# Patient Record
Sex: Male | Born: 1985 | Race: White | Hispanic: No | Marital: Single | Smoking: Former smoker
Health system: Southern US, Community
[De-identification: ages and names within clinical notes are randomized; demographics above are authoritative.]

## PROBLEM LIST (undated history)

## (undated) HISTORY — PX: HIP SURGERY: SHX245

---

## 2014-06-03 ENCOUNTER — Emergency Department: Payer: Self-pay | Admitting: Emergency Medicine

## 2019-12-09 ENCOUNTER — Encounter
Admission: EM | Disposition: A | Discharge status: Home Health | Payer: Self-pay | Source: Home / Self Care | Attending: Internal Medicine

## 2019-12-09 ENCOUNTER — Inpatient Hospital Stay: Payer: Self-pay | Admitting: Certified Registered Nurse Anesthetist

## 2019-12-09 ENCOUNTER — Emergency Department: Payer: Self-pay

## 2019-12-09 ENCOUNTER — Inpatient Hospital Stay
Admission: EM | Admit: 2019-12-09 | Discharge: 2019-12-11 | DRG: 482 | Disposition: A | Discharge status: Home Health | Payer: Self-pay | Attending: Internal Medicine | Admitting: Internal Medicine

## 2019-12-09 ENCOUNTER — Inpatient Hospital Stay: Payer: Self-pay

## 2019-12-09 ENCOUNTER — Other Ambulatory Visit: Payer: Self-pay

## 2019-12-09 ENCOUNTER — Encounter: Payer: Self-pay | Admitting: Emergency Medicine

## 2019-12-09 DIAGNOSIS — F17290 Nicotine dependence, other tobacco product, uncomplicated: Secondary | ICD-10-CM | POA: Diagnosis present

## 2019-12-09 DIAGNOSIS — S72001A Fracture of unspecified part of neck of right femur, initial encounter for closed fracture: Principal | ICD-10-CM

## 2019-12-09 DIAGNOSIS — K219 Gastro-esophageal reflux disease without esophagitis: Secondary | ICD-10-CM | POA: Diagnosis present

## 2019-12-09 DIAGNOSIS — R52 Pain, unspecified: Secondary | ICD-10-CM

## 2019-12-09 DIAGNOSIS — D72829 Elevated white blood cell count, unspecified: Secondary | ICD-10-CM

## 2019-12-09 DIAGNOSIS — Z20828 Contact with and (suspected) exposure to other viral communicable diseases: Secondary | ICD-10-CM | POA: Diagnosis present

## 2019-12-09 DIAGNOSIS — S7291XA Unspecified fracture of right femur, initial encounter for closed fracture: Secondary | ICD-10-CM | POA: Diagnosis present

## 2019-12-09 DIAGNOSIS — R739 Hyperglycemia, unspecified: Secondary | ICD-10-CM

## 2019-12-09 DIAGNOSIS — Y92008 Other place in unspecified non-institutional (private) residence as the place of occurrence of the external cause: Secondary | ICD-10-CM

## 2019-12-09 DIAGNOSIS — S0081XA Abrasion of other part of head, initial encounter: Secondary | ICD-10-CM

## 2019-12-09 HISTORY — PX: PERCUTANEOUS PINNING: SHX2209

## 2019-12-09 LAB — BASIC METABOLIC PANEL
Anion gap: 13 (ref 5–15)
BUN: 13 mg/dL (ref 6–20)
CO2: 25 mmol/L (ref 22–32)
Calcium: 9.4 mg/dL (ref 8.9–10.3)
Chloride: 98 mmol/L (ref 98–111)
Creatinine, Ser: 0.81 mg/dL (ref 0.61–1.24)
GFR calc Af Amer: >60 mL/min (ref >60)
GFR calc non Af Amer: >60 mL/min (ref >60)
Glucose, Bld: 114 mg/dL — ABNORMAL HIGH (ref 70–99)
Potassium: 3.8 mmol/L (ref 3.5–5.1)
Sodium: 136 mmol/L (ref 135–145)

## 2019-12-09 LAB — CBC
HCT: 46.6 % (ref 39.0–52.0)
Hemoglobin: 15.8 g/dL (ref 13.0–17.0)
MCH: 29.8 pg (ref 26.0–34.0)
MCHC: 33.9 g/dL (ref 30.0–36.0)
MCV: 87.9 fL (ref 80.0–100.0)
Platelets: 238 10*3/uL (ref 150–400)
RBC: 5.3 MIL/uL (ref 4.22–5.81)
RDW: 12.6 % (ref 11.5–15.5)
WBC: 17.3 10*3/uL — ABNORMAL HIGH (ref 4.0–10.5)
nRBC: 0 % (ref 0.0–0.2)

## 2019-12-09 LAB — URINE DRUG SCREEN, QUALITATIVE (ARMC ONLY)
Amphetamines, Ur Screen: NOT DETECTED
Barbiturates, Ur Screen: NOT DETECTED
Benzodiazepine, Ur Scrn: NOT DETECTED
Cannabinoid 50 Ng, Ur ~~LOC~~: POSITIVE — AB
Cocaine Metabolite,Ur ~~LOC~~: NOT DETECTED
MDMA (Ecstasy)Ur Screen: NOT DETECTED
Methadone Scn, Ur: NOT DETECTED
Opiate, Ur Screen: POSITIVE — AB
Phencyclidine (PCP) Ur S: NOT DETECTED
Tricyclic, Ur Screen: NOT DETECTED

## 2019-12-09 LAB — RESPIRATORY PANEL BY RT PCR (FLU A&B, COVID)
Influenza A by PCR: NEGATIVE
Influenza B by PCR: NEGATIVE
SARS Coronavirus 2 by RT PCR: NEGATIVE

## 2019-12-09 LAB — TYPE AND SCREEN
ABO/RH(D): A POS
Antibody Screen: NEGATIVE

## 2019-12-09 LAB — PROTIME-INR
INR: 1 (ref 0.8–1.2)
Prothrombin Time: 13.4 seconds (ref 11.4–15.2)

## 2019-12-09 LAB — ETHANOL: Alcohol, Ethyl (B): <10 mg/dL (ref <10)

## 2019-12-09 SURGERY — PINNING, EXTREMITY, PERCUTANEOUS
Anesthesia: General | Laterality: Right

## 2019-12-09 MED ORDER — FENTANYL CITRATE (PF) 100 MCG/2ML IJ SOLN
INTRAMUSCULAR | Status: DC | PRN
  Administered 2019-12-09 (×2): 50 ug via INTRAVENOUS

## 2019-12-09 MED ORDER — OXYCODONE HCL 5 MG/5ML PO SOLN: 5.0000 mg | Freq: Once | ORAL | Status: DC | PRN

## 2019-12-09 MED ORDER — MAGNESIUM HYDROXIDE 400 MG/5ML PO SUSP: 30.0000 mL | Freq: Every day | ORAL | Status: DC | PRN

## 2019-12-09 MED ORDER — CEFAZOLIN SODIUM 1 G IJ SOLR
INTRAMUSCULAR | Status: AC
  Filled 2019-12-09: qty 20

## 2019-12-09 MED ORDER — OXYCODONE HCL 5 MG PO TABS
10.0000 mg | ORAL_TABLET | ORAL | Status: DC | PRN
  Administered 2019-12-10: 10 mg via ORAL

## 2019-12-09 MED ORDER — BISACODYL 10 MG RE SUPP: 10.0000 mg | Freq: Every day | RECTAL | Status: DC | PRN

## 2019-12-09 MED ORDER — ONDANSETRON HCL 4 MG PO TABS: 4.0000 mg | ORAL_TABLET | Freq: Four times a day (QID) | ORAL | Status: DC | PRN

## 2019-12-09 MED ORDER — GABAPENTIN 300 MG PO CAPS
300.0000 mg | ORAL_CAPSULE | Freq: Every day | ORAL | Status: DC
  Administered 2019-12-10: 300 mg via ORAL
  Filled 2019-12-09 (×2): qty 1

## 2019-12-09 MED ORDER — DEXAMETHASONE SODIUM PHOSPHATE 10 MG/ML IJ SOLN
INTRAMUSCULAR | Status: AC
  Filled 2019-12-09: qty 1

## 2019-12-09 MED ORDER — ACETAMINOPHEN 650 MG RE SUPP: 650.0000 mg | Freq: Four times a day (QID) | RECTAL | Status: DC | PRN

## 2019-12-09 MED ORDER — ONDANSETRON HCL 4 MG/2ML IJ SOLN: 4.0000 mg | Freq: Four times a day (QID) | INTRAMUSCULAR | Status: DC | PRN

## 2019-12-09 MED ORDER — PROPOFOL 10 MG/ML IV BOLUS
INTRAVENOUS | Status: AC
  Filled 2019-12-09: qty 20

## 2019-12-09 MED ORDER — CEFAZOLIN SODIUM-DEXTROSE 2-4 GM/100ML-% IV SOLN
2.0000 g | Freq: Four times a day (QID) | INTRAVENOUS | Status: AC
  Administered 2019-12-09 – 2019-12-10 (×4): 2 g via INTRAVENOUS
  Filled 2019-12-09 (×5): qty 100

## 2019-12-09 MED ORDER — FENTANYL CITRATE (PF) 100 MCG/2ML IJ SOLN
INTRAMUSCULAR | Status: AC
  Filled 2019-12-09: qty 2

## 2019-12-09 MED ORDER — SODIUM CHLORIDE 0.9 % IV SOLN: INTRAVENOUS | Status: DC

## 2019-12-09 MED ORDER — ONDANSETRON HCL 4 MG/2ML IJ SOLN
4.0000 mg | Freq: Once | INTRAMUSCULAR | Status: AC
  Administered 2019-12-09: 4 mg via INTRAVENOUS
  Filled 2019-12-09: qty 2

## 2019-12-09 MED ORDER — SUCCINYLCHOLINE CHLORIDE 20 MG/ML IJ SOLN
INTRAMUSCULAR | Status: AC
  Filled 2019-12-09: qty 1

## 2019-12-09 MED ORDER — KETOROLAC TROMETHAMINE 30 MG/ML IJ SOLN
30.0000 mg | Freq: Four times a day (QID) | INTRAMUSCULAR | Status: DC | PRN
  Administered 2019-12-09 – 2019-12-10 (×2): 30 mg via INTRAVENOUS
  Filled 2019-12-09 (×2): qty 1

## 2019-12-09 MED ORDER — OXYCODONE HCL 5 MG PO TABS
5.0000 mg | ORAL_TABLET | ORAL | Status: DC | PRN
  Administered 2019-12-09 – 2019-12-10 (×2): 5 mg via ORAL
  Filled 2019-12-09 (×3): qty 1

## 2019-12-09 MED ORDER — DEXMEDETOMIDINE HCL 200 MCG/2ML IV SOLN
INTRAVENOUS | Status: DC | PRN
  Administered 2019-12-09: 8 ug via INTRAVENOUS

## 2019-12-09 MED ORDER — ACETAMINOPHEN 325 MG PO TABS: 650.0000 mg | ORAL_TABLET | Freq: Four times a day (QID) | ORAL | Status: DC | PRN

## 2019-12-09 MED ORDER — ONDANSETRON HCL 4 MG/2ML IJ SOLN
INTRAMUSCULAR | Status: DC | PRN
  Administered 2019-12-09: 4 mg via INTRAVENOUS

## 2019-12-09 MED ORDER — HYDROMORPHONE HCL 1 MG/ML IJ SOLN: 1.0000 mg | INTRAMUSCULAR | Status: DC | PRN

## 2019-12-09 MED ORDER — LIDOCAINE HCL (CARDIAC) PF 100 MG/5ML IV SOSY
PREFILLED_SYRINGE | INTRAVENOUS | Status: DC | PRN
  Administered 2019-12-09: 80 mg via INTRAVENOUS

## 2019-12-09 MED ORDER — PHENOL 1.4 % MT LIQD
1.0000 | OROMUCOSAL | Status: DC | PRN
  Filled 2019-12-09: qty 177

## 2019-12-09 MED ORDER — ASPIRIN EC 325 MG PO TBEC
325.0000 mg | DELAYED_RELEASE_TABLET | Freq: Every day | ORAL | Status: DC
  Administered 2019-12-10 – 2019-12-11 (×2): 325 mg via ORAL
  Filled 2019-12-09 (×3): qty 1

## 2019-12-09 MED ORDER — MORPHINE SULFATE (PF) 4 MG/ML IV SOLN
4.0000 mg | Freq: Once | INTRAVENOUS | Status: AC
  Administered 2019-12-09: 4 mg via INTRAVENOUS
  Filled 2019-12-09: qty 1

## 2019-12-09 MED ORDER — PROPOFOL 10 MG/ML IV BOLUS
INTRAVENOUS | Status: DC | PRN
  Administered 2019-12-09: 120 mg via INTRAVENOUS

## 2019-12-09 MED ORDER — METOCLOPRAMIDE HCL 10 MG PO TABS: 5.0000 mg | ORAL_TABLET | Freq: Three times a day (TID) | ORAL | Status: DC | PRN

## 2019-12-09 MED ORDER — TETANUS-DIPHTH-ACELL PERTUSSIS 5-2.5-18.5 LF-MCG/0.5 IM SUSP
0.5000 mL | Freq: Once | INTRAMUSCULAR | Status: AC
  Administered 2019-12-09: 07:00:00 0.5 mL via INTRAMUSCULAR
  Filled 2019-12-09: qty 0.5

## 2019-12-09 MED ORDER — ONDANSETRON HCL 4 MG/2ML IJ SOLN
INTRAMUSCULAR | Status: AC
  Filled 2019-12-09: qty 2

## 2019-12-09 MED ORDER — FENTANYL CITRATE (PF) 100 MCG/2ML IJ SOLN
25.0000 ug | INTRAMUSCULAR | Status: DC | PRN
  Administered 2019-12-09 (×4): 50 ug via INTRAVENOUS

## 2019-12-09 MED ORDER — SUCCINYLCHOLINE CHLORIDE 20 MG/ML IJ SOLN
INTRAMUSCULAR | Status: DC | PRN
  Administered 2019-12-09: 100 mg via INTRAVENOUS

## 2019-12-09 MED ORDER — POLYETHYLENE GLYCOL 3350 17 G PO PACK: 17.0000 g | PACK | Freq: Every day | ORAL | Status: DC | PRN

## 2019-12-09 MED ORDER — MIDAZOLAM HCL 2 MG/2ML IJ SOLN
INTRAMUSCULAR | Status: DC | PRN
  Administered 2019-12-09: 2 mg via INTRAVENOUS

## 2019-12-09 MED ORDER — METOCLOPRAMIDE HCL 5 MG/ML IJ SOLN: 5.0000 mg | Freq: Three times a day (TID) | INTRAMUSCULAR | Status: DC | PRN

## 2019-12-09 MED ORDER — OXYCODONE HCL 5 MG PO TABS: 5.0000 mg | ORAL_TABLET | Freq: Once | ORAL | Status: DC | PRN

## 2019-12-09 MED ORDER — FLEET ENEMA 7-19 GM/118ML RE ENEM: 1.0000 | ENEMA | Freq: Once | RECTAL | Status: DC | PRN

## 2019-12-09 MED ORDER — LIDOCAINE HCL (PF) 2 % IJ SOLN
INTRAMUSCULAR | Status: AC
  Filled 2019-12-09: qty 10

## 2019-12-09 MED ORDER — MENTHOL 3 MG MT LOZG
1.0000 | LOZENGE | OROMUCOSAL | Status: DC | PRN
  Filled 2019-12-09: qty 9

## 2019-12-09 MED ORDER — MIDAZOLAM HCL 2 MG/2ML IJ SOLN
INTRAMUSCULAR | Status: AC
  Filled 2019-12-09: qty 2

## 2019-12-09 MED ORDER — CELECOXIB 200 MG PO CAPS
200.0000 mg | ORAL_CAPSULE | Freq: Two times a day (BID) | ORAL | Status: DC
  Administered 2019-12-10 – 2019-12-11 (×3): 200 mg via ORAL
  Filled 2019-12-09 (×4): qty 1

## 2019-12-09 MED ORDER — CEFAZOLIN SODIUM-DEXTROSE 2-3 GM-%(50ML) IV SOLR
INTRAVENOUS | Status: DC | PRN
  Administered 2019-12-09: 2 g via INTRAVENOUS

## 2019-12-09 MED ORDER — DEXAMETHASONE SODIUM PHOSPHATE 10 MG/ML IJ SOLN
INTRAMUSCULAR | Status: DC | PRN
  Administered 2019-12-09: 10 mg via INTRAVENOUS

## 2019-12-09 MED ORDER — LACTATED RINGERS IV SOLN: INTRAVENOUS | Status: DC | PRN

## 2019-12-09 MED ORDER — FERROUS SULFATE 325 (65 FE) MG PO TABS
325.0000 mg | ORAL_TABLET | Freq: Two times a day (BID) | ORAL | Status: DC
  Administered 2019-12-09 – 2019-12-11 (×4): 325 mg via ORAL
  Filled 2019-12-09 (×5): qty 1

## 2019-12-09 SURGICAL SUPPLY — 29 items
BIT DRILL 4.9 CANNULATED (BIT) ×1
BIT DRILL CANN QC 4.9 LRG (BIT) ×1 IMPLANT
BLADE SURG 15 STRL LF DISP TIS (BLADE) ×1 IMPLANT
BLADE SURG 15 STRL SS (BLADE) ×2
CHLORAPREP W/TINT 26 (MISCELLANEOUS) ×3 IMPLANT
COVER WAND RF STERILE (DRAPES) ×3 IMPLANT
CUFF TOURN SGL QUICK 18X4 (TOURNIQUET CUFF) IMPLANT
DRILL BIT CANNULATED 4.9 (BIT) ×2
ELECT CAUTERY BLADE 6.4 (BLADE) ×3 IMPLANT
GAUZE SPONGE 4X4 12PLY STRL (GAUZE/BANDAGES/DRESSINGS) ×3 IMPLANT
GAUZE XEROFORM 1X8 LF (GAUZE/BANDAGES/DRESSINGS) ×3 IMPLANT
GLOVE BIO SURGEON STRL SZ8 (GLOVE) ×3 IMPLANT
GLOVE BIO SURGEON STRL SZ8.5 (GLOVE) ×3 IMPLANT
GLOVE INDICATOR 8.0 STRL GRN (GLOVE) ×12 IMPLANT
GLOVE SURG ORTHO 8.0 STRL STRW (GLOVE) ×3 IMPLANT
GOWN STRL REUS W/ TWL LRG LVL3 (GOWN DISPOSABLE) ×1 IMPLANT
GOWN STRL REUS W/ TWL XL LVL3 (GOWN DISPOSABLE) ×1 IMPLANT
GOWN STRL REUS W/TWL LRG LVL3 (GOWN DISPOSABLE) ×2
GOWN STRL REUS W/TWL XL LVL3 (GOWN DISPOSABLE) ×2
GUIDEWIRE THRD ASNIS 3.2X300 (WIRE) ×9 IMPLANT
KIT TURNOVER KIT A (KITS) ×3 IMPLANT
PACK BASIC III (MISCELLANEOUS) ×2
PACK SRG BSC III STRL LF (MISCELLANEOUS) ×1 IMPLANT
PAD CAST CTTN 4X4 STRL (SOFTGOODS) ×1 IMPLANT
PADDING CAST COTTON 4X4 STRL (SOFTGOODS) ×2
SCREW ASNIS 100MM (Screw) ×3 IMPLANT
SCREW ASNIS 95MM (Screw) ×6 IMPLANT
STRAP SAFETY 5IN WIDE (MISCELLANEOUS) ×3 IMPLANT
WASHER SCREW MATTA SS 13.0X1.5 (Washer) ×3 IMPLANT
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
## 2019-12-09 NOTE — Transfer of Care (Signed)
Immediate Anesthesia Transfer of Care Note  Patient: Antonio Wagner  Procedure(s) Performed: PERCUTANEOUS PINNING EXTREMITY (Right )  Patient Location: PACU  Anesthesia Type:General  Level of Consciousness: sedated  Airway & Oxygen Therapy: Patient Spontanous Breathing and Patient connected to face mask oxygen  Post-op Assessment: Report given to RN and Post -op Vital signs reviewed and stable  Post vital signs: Reviewed and stable  Last Vitals:  Vitals Value Taken Time  BP 119/91 12/09/19 1211  Temp 36.5 C 12/09/19 1208  Pulse 119 12/09/19 1217  Resp 18 12/09/19 1217  SpO2 100 % 12/09/19 1217  Vitals shown include unvalidated device data.  Last Pain:  Vitals:   12/09/19 1208  TempSrc:   PainSc: Asleep         Complications: No apparent anesthesia complications

## 2019-12-09 NOTE — ED Notes (Signed)
Pt transported to OR
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
## 2019-12-09 NOTE — ED Notes (Signed)
Pt in CT.
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
## 2019-12-09 NOTE — Anesthesia Post-op Follow-up Note (Signed)
Anesthesia QCDR form completed.        
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
## 2019-12-09 NOTE — ED Notes (Signed)
Surgeon at bedside- states pt can have ice chips

## 2019-12-09 NOTE — ED Notes (Signed)
Pt given phone to call family.

## 2019-12-09 NOTE — ED Notes (Signed)
Antonio Wagner ed tech picked pt up from wheelchair and placed in bed, due to pt's declination to move self. Pt complains of pain from right hip to right knee.

## 2019-12-09 NOTE — ED Notes (Signed)
Dr. Amin at bedside.

## 2019-12-09 NOTE — ED Triage Notes (Signed)
X-ray unable to complete test due to patient not being able to stand.

## 2019-12-09 NOTE — ED Notes (Signed)
Pt to xray
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
## 2019-12-09 NOTE — ED Notes (Signed)
Called OR and informed them that pt was ready- stated they would be by to get pt shortly
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
## 2019-12-09 NOTE — Anesthesia Postprocedure Evaluation (Signed)
Anesthesia Post Note  Patient: Antonio Wagner  Procedure(s) Performed: PERCUTANEOUS PINNING EXTREMITY (Right )  Patient location during evaluation: PACU Anesthesia Type: General Level of consciousness: awake and alert Pain management: pain level controlled Vital Signs Assessment: post-procedure vital signs reviewed and stable Respiratory status: spontaneous breathing, nonlabored ventilation, respiratory function stable and patient connected to nasal cannula oxygen Cardiovascular status: blood pressure returned to baseline and stable Postop Assessment: no apparent nausea or vomiting Anesthetic complications: no     Last Vitals:  Vitals:   12/09/19 1245 12/09/19 1254  BP:    Pulse: 98 99  Resp: 20 18  Temp:  36.9 C  SpO2: 100% 99%    Last Pain:  Vitals:   12/09/19 1254  TempSrc:   PainSc: 0-No pain                 Precious Haws Jeremiah Tarpley

## 2019-12-09 NOTE — ED Triage Notes (Signed)
Pt given pillow for comfort in the [place]. Pt not willing to attempt x-ray. Pt reported he was walking earlier but now the pain is worse and he does not think he can do it. RN offered to accompany pt to x-ray and assist, pt states he will just wait until he gets a bed

## 2019-12-09 NOTE — Anesthesia Procedure Notes (Signed)
Procedure Name: Intubation Date/Time: 12/09/2019 11:12 AM Performed by: Caryl Asp, CRNA Pre-anesthesia Checklist: Patient identified, Patient being monitored, Timeout performed, Emergency Drugs available and Suction available Patient Re-evaluated:Patient Re-evaluated prior to induction Oxygen Delivery Method: Circle system utilized Preoxygenation: Pre-oxygenation with 100% oxygen Induction Type: IV induction Ventilation: Mask ventilation without difficulty Laryngoscope Size: McGraph and 4 Grade View: Grade I Tube type: Oral Tube size: 7.5 mm Number of attempts: 1 Airway Equipment and Method: Stylet Placement Confirmation: ETT inserted through vocal cords under direct vision,  positive ETCO2 and breath sounds checked- equal and bilateral Secured at: 24 cm Tube secured with: Tape Dental Injury: Teeth and Oropharynx as per pre-operative assessment

## 2019-12-09 NOTE — ED Notes (Signed)
Pt gives verbal permission to discuss care with father Nilay Mangrum and [place]
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
## 2019-12-10 DIAGNOSIS — R739 Hyperglycemia, unspecified: Secondary | ICD-10-CM

## 2019-12-10 DIAGNOSIS — D72828 Other elevated white blood cell count: Secondary | ICD-10-CM

## 2019-12-10 DIAGNOSIS — D72829 Elevated white blood cell count, unspecified: Secondary | ICD-10-CM

## 2019-12-10 DIAGNOSIS — S7291XA Unspecified fracture of right femur, initial encounter for closed fracture: Secondary | ICD-10-CM

## 2019-12-10 LAB — BASIC METABOLIC PANEL
Anion gap: 10 (ref 5–15)
BUN: 13 mg/dL (ref 6–20)
CO2: 26 mmol/L (ref 22–32)
Calcium: 8.7 mg/dL — ABNORMAL LOW (ref 8.9–10.3)
Chloride: 101 mmol/L (ref 98–111)
Creatinine, Ser: 0.67 mg/dL (ref 0.61–1.24)
GFR calc Af Amer: >60 mL/min (ref >60)
GFR calc non Af Amer: >60 mL/min (ref >60)
Glucose, Bld: 129 mg/dL — ABNORMAL HIGH (ref 70–99)
Potassium: 4.1 mmol/L (ref 3.5–5.1)
Sodium: 137 mmol/L (ref 135–145)

## 2019-12-10 LAB — HIV ANTIBODY (ROUTINE TESTING W REFLEX): HIV Screen 4th Generation wRfx: NONREACTIVE

## 2019-12-10 LAB — CBC
HCT: 37.7 % — ABNORMAL LOW (ref 39.0–52.0)
Hemoglobin: 12.8 g/dL — ABNORMAL LOW (ref 13.0–17.0)
MCH: 30.1 pg (ref 26.0–34.0)
MCHC: 34 g/dL (ref 30.0–36.0)
MCV: 88.7 fL (ref 80.0–100.0)
Platelets: 217 10*3/uL (ref 150–400)
RBC: 4.25 MIL/uL (ref 4.22–5.81)
RDW: 12.7 % (ref 11.5–15.5)
WBC: 18.8 10*3/uL — ABNORMAL HIGH (ref 4.0–10.5)
nRBC: 0 % (ref 0.0–0.2)
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
## 2019-12-10 NOTE — Progress Notes (Signed)
Pt alert and oriented. Medicated for pain during the night with good results. Pt able to sleep some in between care. Surgical dressing dry and intact. Iv infusing without difficulty. Pt voiding without difficulty in the urinal.
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
## 2019-12-10 NOTE — Plan of Care (Signed)
Patient is to be foot flat weightbearing to surgical extremity, not WBAT. Cassell Smiles, PA-C orthopaedics

## 2019-12-11 LAB — BASIC METABOLIC PANEL
Anion gap: 9 (ref 5–15)
BUN: 17 mg/dL (ref 6–20)
CO2: 28 mmol/L (ref 22–32)
Calcium: 8.9 mg/dL (ref 8.9–10.3)
Chloride: 103 mmol/L (ref 98–111)
Creatinine, Ser: 0.94 mg/dL (ref 0.61–1.24)
GFR calc Af Amer: >60 mL/min (ref >60)
GFR calc non Af Amer: >60 mL/min (ref >60)
Glucose, Bld: 104 mg/dL — ABNORMAL HIGH (ref 70–99)
Potassium: 4 mmol/L (ref 3.5–5.1)
Sodium: 140 mmol/L (ref 135–145)

## 2019-12-11 LAB — CBC
HCT: 37.6 % — ABNORMAL LOW (ref 39.0–52.0)
Hemoglobin: 12.6 g/dL — ABNORMAL LOW (ref 13.0–17.0)
MCH: 29.9 pg (ref 26.0–34.0)
MCHC: 33.5 g/dL (ref 30.0–36.0)
MCV: 89.1 fL (ref 80.0–100.0)
Platelets: 202 10*3/uL (ref 150–400)
RBC: 4.22 MIL/uL (ref 4.22–5.81)
RDW: 12.8 % (ref 11.5–15.5)
WBC: 10 10*3/uL (ref 4.0–10.5)
nRBC: 0 % (ref 0.0–0.2)

## 2019-12-11 MED ORDER — ACETAMINOPHEN 325 MG PO TABS: 650.0000 mg | ORAL_TABLET | Freq: Four times a day (QID) | ORAL | Status: AC | PRN

## 2019-12-11 MED ORDER — OXYCODONE HCL 5 MG PO TABS: 5.0000 mg | ORAL_TABLET | ORAL | 0 refills | Status: DC | PRN

## 2019-12-11 MED ORDER — ASPIRIN 325 MG PO TBEC: 325.0000 mg | DELAYED_RELEASE_TABLET | Freq: Every day | ORAL | 0 refills | Status: AC

## 2019-12-11 MED ORDER — TRAMADOL HCL 50 MG PO TABS: 50.0000 mg | ORAL_TABLET | Freq: Four times a day (QID) | ORAL | 0 refills | Status: DC | PRN
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
## 2019-12-11 NOTE — TOC Progression Note (Signed)
Transition of Care [facility]) - Progression Note    Patient Details  Name: Antonio Wagner MRN: [id number] Date of Birth: 1986/08/31  Transition of Care [facility]) CM/SW [place], RN Phone Number: 12/11/2019, 11:42 AM  Clinical Narrative:    Met with the patient and provided him with an [facility] application and sent over the referral via email.  The patient has no PCP and no insurance.          Expected Discharge Plan and Services                                                 Social Determinants of Health (SDOH) Interventions    Readmission Risk Interventions No flowsheet data found.
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
## 2019-12-11 NOTE — TOC Transition Note (Signed)
Transition of Care [facility]) - CM/SW Discharge Note   Patient Details  Name: Antonio Wagner MRN: [id number] Date of Birth: [birth year]  Transition of Care [facility] CM/SW Contact:  Su Hilt, RN Phone Number: 12/11/2019, 1:46 PM   Clinical Narrative:    Patient discharging to his friend george's house and his girlfriend is to transport, [facility] set up with [facility] and a RW provided, he was given a [facility] application and they will contact him to set up appointment   Final next level of care: [place] Barriers to Discharge: Barriers Resolved   Patient Goals and CMS Choice        Discharge Placement                       Discharge Plan and Services                DME Arranged: Walker rolling DME Agency: [employer] Date DME Agency Contacted: 12/10/19 Time DME Agency Contacted: 76 Representative spoke with at DME Agency: [place]: PT [place] Agency: [facility] [place] Date [place]: 12/11/19 Time [place]: [phone] Representative spoke with at [place]: [place] ([place]) Interventions     Readmission Risk Interventions No flowsheet data found.

## 2019-12-11 NOTE — Discharge Instructions (Signed)
Orthopaedic Instructions: -you are flat foot, touch down weightbearing. This means you may rest the affected foot flat on the floor but may not use the foot to support your weight. -keep the incisions clean and dry until your follow-up appointment  -take 1 325mg  Aspirin once daily until instructed to stop  -f/u in 2 weeks with your orthopaedic provider
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
## 2022-07-15 ENCOUNTER — Other Ambulatory Visit: Payer: Self-pay

## 2022-07-15 ENCOUNTER — Encounter: Payer: Self-pay | Admitting: Intensive Care

## 2022-07-15 ENCOUNTER — Emergency Department
Admission: EM | Admit: 2022-07-15 | Discharge: 2022-07-15 | Disposition: A | Discharge status: Home / Self Care | Payer: Self-pay | Attending: Emergency Medicine | Admitting: Emergency Medicine

## 2022-07-15 DIAGNOSIS — K047 Periapical abscess without sinus: Secondary | ICD-10-CM | POA: Insufficient documentation

## 2022-07-15 MED ORDER — AMOXICILLIN-POT CLAVULANATE 875-125 MG PO TABS: 1.0000 | ORAL_TABLET | Freq: Two times a day (BID) | ORAL | 0 refills | Status: AC

## 2022-07-15 MED ORDER — TRAMADOL HCL 50 MG PO TABS
50.0000 mg | ORAL_TABLET | Freq: Once | ORAL | Status: AC
  Administered 2022-07-15: 50 mg via ORAL
  Filled 2022-07-15: qty 1

## 2022-07-15 MED ORDER — HYDROCODONE-ACETAMINOPHEN 5-325 MG PO TABS: 1.0000 | ORAL_TABLET | Freq: Three times a day (TID) | ORAL | 0 refills | Status: AC | PRN

## 2022-07-15 MED ORDER — AMOXICILLIN-POT CLAVULANATE 875-125 MG PO TABS
1.0000 | ORAL_TABLET | Freq: Once | ORAL | Status: AC
  Administered 2022-07-15: 1 via ORAL
  Filled 2022-07-15: qty 1

## 2022-07-15 NOTE — Discharge Instructions (Addendum)
Take antibiotic as directed, and the pain medicine as needed.  Follow-up with one of the local dental providers for definitive management.  Return to the ED if needed. OPTIONS FOR DENTAL FOLLOW UP CARE  [place] [facility] and [facility] - Local Safety Net Dental Clinics [email or web address]   [facility] [phone])  Sharl Ma [phone])  [place] [phone])  [facility] [phone])  [facility] [phone]) This clinic caters to the indigent population and is on a lottery system. Location: [employer] [facility], [employer], [address], [place] Clinic Hours: Wednesdays from 6pm - 9pm, patients seen by a lottery system. For dates, call or go to [email or web address] Services: Cleanings, fillings and simple extractions. Payment Options: DENTAL WORK IS FREE OF CHARGE. Bring proof of income or support. Best way to get seen: Arrive at 5:15 pm - this is a lottery, NOT first come/first serve, so arriving earlier will not increase your chances of being seen.     [facility] [phone] Select option 1 for emergencies   Location: [facility], [place], [address], [place] Clinic Hours: No walk-ins accepted - call the day before to schedule an appointment. Check in times are 9:30 am and 1:30 pm. Services: Simple extractions, temporary fillings, pulpectomy/pulp debridement, uncomplicated abscess drainage. Payment Options: PAYMENT IS DUE AT THE TIME OF SERVICE.  Fee is usually $100-200, additional surgical procedures (e.g. abscess drainage) may be extra. Cash, checks, Visa/MasterCard accepted.  Can file Medicaid if patient is covered for dental - patient should call case worker to check. No discount for [facility] patients. Best way to get seen: MUST  call the day before and get onto the schedule. Can usually be seen the next 1-2 days. No walk-ins accepted.     [facility] [phone]   Location: [facility], [address], [place] Clinic Hours: M, W, Th, F 8am or 1:30pm, Tues 9a or 1:30 - first come/first served. Services: Simple extractions, temporary fillings, uncomplicated abscess drainage.  You do not need to be an [facility] resident. Payment Options: PAYMENT IS DUE AT THE TIME OF SERVICE. Dental insurance, otherwise sliding scale - bring proof of income or support. Depending on income and treatment needed, cost is usually $50-200. Best way to get seen: Arrive early as it is first come/first served.     [facility] [phone]   Location: [address] Clinic Hours: Mon-Thu 8a-5p Services: Most basic dental services including extractions and fillings. Payment Options: PAYMENT IS DUE AT THE TIME OF SERVICE. Sliding scale, up to 50% off - bring proof if income or support. Medicaid with dental option accepted. Best way to get seen: Call to schedule an appointment, can usually be seen within 2 weeks OR they will try to see walk-ins - show up at 8a or 2p (you may have to wait).     [facility] [phone] [place] RESIDENTS ONLY   Location: [facility], [address], [place], [place] [address] Clinic Hours: By appointment only. Monday - Thursday 8am-5pm, Friday 8am-12pm Services: Cleanings, fillings, extractions. Payment Options: PAYMENT IS DUE AT THE TIME OF SERVICE. Cash, Visa or MasterCard. Sliding scale - $30 minimum per service. Best way to get seen: Come in to office, complete packet and make an appointment - need proof of income or support monies for each household member and  proof of [facility] residence. Usually takes about a month to get in.     [facility] [phone]   Location: [address], [facility] Clinic Hours: Walk-in Urgent Care Dental Services are offered Monday-Friday mornings only. The numbers of emergencies accepted daily is limited to the number of providers available. Maximum 15 - Mondays, Wednesdays & Thursdays Maximum 10 - Tuesdays & Fridays Services: You do not need to be a [facility] resident to be seen for a dental emergency. Emergencies are defined as pain, swelling, abnormal bleeding, or dental trauma. Walkins will receive x-rays if needed. NOTE: Dental cleaning is not an emergency. Payment Options: PAYMENT IS DUE AT THE TIME OF SERVICE. Minimum co-pay is $40.00 for uninsured patients. Minimum co-pay is $3.00 for Medicaid with dental coverage. Dental Insurance is accepted and must be presented at time of visit. Medicare does not cover dental. Forms of payment: Cash, credit card, checks. Best way to get seen: If not previously registered with the clinic, walk-in dental registration begins at 7:15 am and is on a first come/first serve basis. If previously registered with the clinic, call to make an appointment.     [facility] [phone] [place] RESIDENTS ONLY   Location: [address] [place], [place] Clinic Hours: Mon-Thu 10a-2p Services: Extractions only! Payment Options: FREE (donations accepted) - bring proof of income or support Best way to get seen: Call and schedule an appointment OR come at 8am on the 1st Monday of every month (except for holidays) when it is first come/first served.     [facility] [phone]   Location: [address] [place], [place] Clinic Hours: Friday mornings Services, Payment Options, Best way to get seen: Call for info

## 2022-07-15 NOTE — ED Provider Notes (Signed)
[facility] Emergency Department Provider Note     Event Date/Time   First MD Initiated Contact with Patient 07/15/22 1517     (approximate)   History   Oral Swelling   HPI  Antonio Wagner is a 36 y.o. male with a medical history consistent with hyperglycemia, presents to the ED with dental pain and pressure.  Patient presents with swelling noted to left side of his face.  He notes a chronically broken and decayed teeth throughout his mouth, localizes pain to an upper left molar.  He denies any difficulty breathing, swallowing, or controlling oral secretions.     Physical Exam   Triage Vital Signs: ED Triage Vitals  Enc Vitals Group     BP 07/15/22 1504 111/86     Pulse Rate 07/15/22 1504 71     Resp 07/15/22 1504 18     Temp 07/15/22 1504 98.8 F (37.1 C)     Temp Source 07/15/22 1504 Oral     SpO2 07/15/22 1504 99 %     Weight 07/15/22 1502 135 lb (61.2 kg)     Height 07/15/22 1502 5\' 9"  (1.753 m)     Head Circumference --      Peak Flow --      Pain Score 07/15/22 1501 7     Pain Loc --      Pain Edu? --      Excl. in GC? --     Most recent vital signs: Vitals:   07/15/22 1504  BP: 111/86  Pulse: 71  Resp: 18  Temp: 98.8 F (37.1 C)  SpO2: 99%    General Awake, no distress.  HEENT NCAT.  Subtle soft tissue swelling over the left nasolabial fold.  No overlying erythema or induration is noted.  PERRL. EOMI. No rhinorrhea. Mucous membranes are moist.  Uvula is midline and tonsils are flat.  No brawny sublingual edema is noted.  Patient with general tooth decay and arrested caries noted throughout.  Some focal gum swelling noted to the left upper jaw at the second molar.  No pointing, fluctuance, or spontaneous purulent drainage is noted. CV:  Good peripheral perfusion.  RESP:  Normal effort.  ABD:  No distention.    ED Results / Procedures / Treatments   Labs (all labs ordered are listed, but only abnormal results are  displayed) Labs Reviewed - No data to display   EKG    RADIOLOGY   No results found.   PROCEDURES:  Critical Care performed: No  Procedures   MEDICATIONS ORDERED IN ED: Medications  amoxicillin-clavulanate (AUGMENTIN) 875-125 MG per tablet 1 tablet (1 tablet Oral Given 07/15/22 1624)  traMADol (ULTRAM) tablet 50 mg (50 mg Oral Given 07/15/22 1624)     IMPRESSION / MDM / ASSESSMENT AND PLAN / ED COURSE  I reviewed the triage vital signs and the nursing notes.                              Differential diagnosis includes, but is not limited to, dental abscess, dental infection, gingivitis, sinusitis  Patient's presentation is most consistent with acute, uncomplicated illness.  Patient's diagnosis is consistent with dental infection secondary to dental caries. Patient will be discharged home with prescriptions for Augmentin and hydrocodone (#6). Patient is to follow up with any number of local dental providers as needed or otherwise directed. Patient is given ED precautions to return to the ED  for any worsening or new symptoms.     FINAL CLINICAL IMPRESSION(S) / ED DIAGNOSES   Final diagnoses:  Dental abscess     Rx / DC Orders   ED Discharge Orders          Ordered    amoxicillin-clavulanate (AUGMENTIN) 875-125 MG tablet  2 times daily        07/15/22 1627    HYDROcodone-acetaminophen (NORCO) 5-325 MG tablet  3 times daily PRN        07/15/22 1627             Note:  This document was prepared using Dragon voice recognition software and may include unintentional dictation errors.    Lissa Hoard, PA-C 07/15/22 1709    Sharman Cheek, MD 07/15/22 [phone]

## 2022-07-15 NOTE — ED Notes (Signed)
See triage note Presents with oral swelling   States having pain to left side of face

## 2022-07-15 NOTE — ED Triage Notes (Signed)
Patient c/o dental pain with pressure and swelling noted to left side of face
